# Patient Record
Sex: Male | Born: 1965 | Race: White | Hispanic: No | Marital: Married | Smoking: Never smoker
Health system: Southern US, Community
[De-identification: ages and names within clinical notes are randomized; demographics above are authoritative.]

## PROBLEM LIST (undated history)

## (undated) DIAGNOSIS — I10 Essential (primary) hypertension: Secondary | ICD-10-CM

## (undated) DIAGNOSIS — G4730 Sleep apnea, unspecified: Secondary | ICD-10-CM

## (undated) HISTORY — PX: ESOPHAGOGASTRODUODENOSCOPY ENDOSCOPY: SHX5814

## (undated) HISTORY — DX: Sleep apnea, unspecified: G47.30

---

## 2015-08-13 ENCOUNTER — Emergency Department (HOSPITAL_COMMUNITY)
Admission: EM | Admit: 2015-08-13 | Discharge: 2015-08-13 | Disposition: A | Discharge status: Home / Self Care | Payer: 59 | Attending: Emergency Medicine | Admitting: Emergency Medicine

## 2015-08-13 ENCOUNTER — Emergency Department (HOSPITAL_COMMUNITY): Payer: 59

## 2015-08-13 ENCOUNTER — Encounter (HOSPITAL_COMMUNITY): Payer: Self-pay

## 2015-08-13 DIAGNOSIS — R109 Unspecified abdominal pain: Secondary | ICD-10-CM | POA: Diagnosis present

## 2015-08-13 DIAGNOSIS — R1031 Right lower quadrant pain: Secondary | ICD-10-CM | POA: Diagnosis not present

## 2015-08-13 DIAGNOSIS — I10 Essential (primary) hypertension: Secondary | ICD-10-CM | POA: Diagnosis not present

## 2015-08-13 DIAGNOSIS — Z79899 Other long term (current) drug therapy: Secondary | ICD-10-CM | POA: Diagnosis not present

## 2015-08-13 DIAGNOSIS — Z7982 Long term (current) use of aspirin: Secondary | ICD-10-CM | POA: Diagnosis not present

## 2015-08-13 HISTORY — DX: Essential (primary) hypertension: I10

## 2015-08-13 LAB — URINALYSIS, ROUTINE W REFLEX MICROSCOPIC
BILIRUBIN URINE: NEGATIVE
Glucose, UA: NEGATIVE mg/dL
Hgb urine dipstick: NEGATIVE
KETONES UR: NEGATIVE mg/dL
Leukocytes, UA: NEGATIVE
NITRITE: NEGATIVE
PROTEIN: NEGATIVE mg/dL
Specific Gravity, Urine: 1.011 (ref 1.005–1.030)
UROBILINOGEN UA: 0.2 mg/dL (ref 0.0–1.0)
pH: 6.5 (ref 5.0–8.0)

## 2015-08-13 LAB — COMPREHENSIVE METABOLIC PANEL
ALBUMIN: 4.5 g/dL (ref 3.5–5.0)
ALK PHOS: 41 U/L (ref 38–126)
ALT: 25 U/L (ref 17–63)
ANION GAP: 7 (ref 5–15)
AST: 21 U/L (ref 15–41)
BILIRUBIN TOTAL: 0.5 mg/dL (ref 0.3–1.2)
BUN: 13 mg/dL (ref 6–20)
CALCIUM: 9.4 mg/dL (ref 8.9–10.3)
CO2: 28 mmol/L (ref 22–32)
Chloride: 107 mmol/L (ref 101–111)
Creatinine, Ser: 1.11 mg/dL (ref 0.61–1.24)
GFR calc Af Amer: >60 mL/min (ref >60)
GLUCOSE: 112 mg/dL — AB (ref 65–99)
Potassium: 4 mmol/L (ref 3.5–5.1)
Sodium: 142 mmol/L (ref 135–145)
TOTAL PROTEIN: 7.2 g/dL (ref 6.5–8.1)

## 2015-08-13 LAB — CBC
HCT: 41.4 % (ref 39.0–52.0)
Hemoglobin: 13.7 g/dL (ref 13.0–17.0)
MCH: 27.3 pg (ref 26.0–34.0)
MCHC: 33.1 g/dL (ref 30.0–36.0)
MCV: 82.5 fL (ref 78.0–100.0)
Platelets: 332 10*3/uL (ref 150–400)
RBC: 5.02 MIL/uL (ref 4.22–5.81)
RDW: 13.5 % (ref 11.5–15.5)
WBC: 8.2 10*3/uL (ref 4.0–10.5)

## 2015-08-13 LAB — LIPASE, BLOOD: Lipase: 29 U/L (ref 22–51)

## 2015-08-13 MED ORDER — SODIUM CHLORIDE 0.9 % IV BOLUS (SEPSIS)
500.0000 mL | Freq: Once | INTRAVENOUS | Status: AC
  Administered 2015-08-13: 500 mL via INTRAVENOUS

## 2015-08-13 MED ORDER — IOHEXOL 300 MG/ML  SOLN
100.0000 mL | Freq: Once | INTRAMUSCULAR | Status: AC | PRN
  Administered 2015-08-13: 100 mL via INTRAVENOUS

## 2015-08-13 NOTE — ED Notes (Signed)
Pt made aware of needed urine specimen  

## 2015-08-13 NOTE — Discharge Instructions (Signed)

## 2015-08-13 NOTE — ED Notes (Signed)
He c/o rlq area abd. Discomfort since this Mon.  He saw a doctor at a local [facility]. Before arrival, who recommended he come here when he was found to have rebound tenderness.

## 2015-08-13 NOTE — ED Provider Notes (Signed)
CSN: [id number]     Arrival date & time 08/13/15  1417 History   First MD Initiated Contact with Patient 08/13/15 1647     Chief Complaint  Patient presents with  . Abdominal Pain     (Consider location/radiation/quality/duration/timing/severity/associated sxs/prior Treatment) Patient is a 49 y.o. male presenting with abdominal pain. The history is provided by the patient.  Abdominal Pain Associated symptoms: no chest pain, no diarrhea, no nausea, no shortness of breath and no vomiting    patient presents with abdominal pain. It is in his right abdomen. Began on Wednesday and discontinued over last few days. Not relieved with bowel movements. He has had a decreased appetite. No fevers or chills. No nausea. States he thought it would go away. States he went to an urgent care today and he was told to come in because he has rebound tenderness. No dysuria. He has not had pain like this before.  Past Medical History  Diagnosis Date  . Hypertension    No past surgical history on file. No family history on file. Social History  Substance Use Topics  . Smoking status: Never Smoker   . Smokeless tobacco: None  . Alcohol Use: Yes    Review of Systems  Constitutional: Positive for appetite change. Negative for activity change.  Eyes: Negative for pain.  Respiratory: Negative for chest tightness and shortness of breath.   Cardiovascular: Negative for chest pain and leg swelling.  Gastrointestinal: Positive for abdominal pain. Negative for nausea, vomiting and diarrhea.  Genitourinary: Negative for flank pain.  Musculoskeletal: Negative for back pain and neck stiffness.  Skin: Negative for rash.  Neurological: Negative for weakness, numbness and headaches.  Psychiatric/Behavioral: Negative for behavioral problems.      Allergies  Review of patient's allergies indicates no known allergies.  Home Medications   Prior to Admission medications   Medication Sig Start Date End Date  Taking? Authorizing Provider  aspirin 81 MG chewable tablet Chew 81 mg by mouth daily.   Yes Historical Provider, MD  losartan (COZAAR) 25 MG tablet Take 25 mg by mouth daily. 07/11/15  Yes Historical Provider, MD  Multiple Vitamin (MULTIVITAMIN WITH MINERALS) TABS tablet Take 1 tablet by mouth daily.   Yes Historical Provider, MD   BP 129/85 mmHg  Pulse 51  Temp(Src) 98.3 F (36.8 C) (Oral)  Resp 18  SpO2 100% Physical Exam  Constitutional: He is oriented to person, place, and time. He appears well-developed and well-nourished.  HENT:  Head: Normocephalic and atraumatic.  Eyes: EOM are normal. Pupils are equal, round, and reactive to light.  Neck: Normal range of motion.  Cardiovascular: Normal rate, regular rhythm and normal heart sounds.   No murmur heard. Pulmonary/Chest: Effort normal and breath sounds normal.  Abdominal: Soft. He exhibits no distension. There is tenderness. There is no rebound.  Tenderness to right mid to lower abdomen. No hernias. No rash. No rebound or guarding.  Musculoskeletal: Normal range of motion. He exhibits no edema.  Neurological: He is alert and oriented to person, place, and time. No cranial nerve deficit.  Skin: Skin is warm and dry.  Nursing note and vitals reviewed.   ED Course  Procedures (including critical care time) Labs Review Labs Reviewed  COMPREHENSIVE METABOLIC PANEL - Abnormal; Notable for the following:    Glucose, Bld 112 (*)    All other components within normal limits  LIPASE, BLOOD  CBC  URINALYSIS, ROUTINE W REFLEX MICROSCOPIC (NOT AT [facility]    Imaging Review Ct  Abdomen Pelvis W Contrast  08/13/2015   CLINICAL DATA:  Right lower quadrant abdominal pain for the past 5 days.  EXAM: CT ABDOMEN AND PELVIS WITH CONTRAST  TECHNIQUE: Multidetector CT imaging of the abdomen and pelvis was performed using the standard protocol following bolus administration of intravenous contrast.  CONTRAST:  OMNIPAQUE IOHEXOL 300 MG/ML   SOLN  COMPARISON:  None.  FINDINGS: The appendix has a normal appearance. Small number of sigmoid colon diverticula without evidence of diverticulitis. No gastric or small bowel abnormalities.  Minimal diffuse low density of the liver relative to the spleen. Normal appearing spleen, pancreas, gallbladder, adrenal glands, left kidney, ureters and urinary bladder. 2.7 cm upper pole right renal cyst. No enlarged lymph nodes.  Tiny umbilical hernia containing fat. Normal sized prostate gland containing a small amount calcification. Small amount of linear scarring at both lung bases. Lumbar and lower thoracic spine degenerative changes.  IMPRESSION: 1. No acute abnormality. 2. Mild sigmoid diverticulosis. 3. Tiny umbilical hernia containing fat.   Electronically Signed   By: Beckie Salts M.D.   On: 08/13/2015 18:23   I have personally reviewed and evaluated these images and lab results as part of my medical decision-making.   EKG Interpretation None      MDM   Final diagnoses:  Right lower quadrant abdominal pain   Patient with abdominal pain. CT and lab work reassuring. Overall benign exam did not show rebound tenderness like patient was referred for. Will discharge home.    Benjiman Core, MD 08/14/15 [id number]

## 2016-05-14 IMAGING — CT CT ABD-PELV W/ CM
2 of 5 series · 16 of 46 positions shown, 18 images · IV contrast (OMNIPAQUE 300)
Comparison: None.

CLINICAL DATA: Right lower quadrant abdominal pain for the past 5
days.

EXAM:
CT ABDOMEN AND PELVIS WITH CONTRAST
TECHNIQUE: Multidetector CT imaging of the abdomen and pelvis was performed
using the standard protocol following bolus administration of
intravenous contrast.
CONTRAST:  100mL OMNIPAQUE IOHEXOL 300 MG/ML  SOLN

[Series 2: abd/pel with · axial · 0.81mm/px · z∈[-501,-36]mm · 13 of 105 slices shown, 15 images]
[im 6/105  soft-tissue]
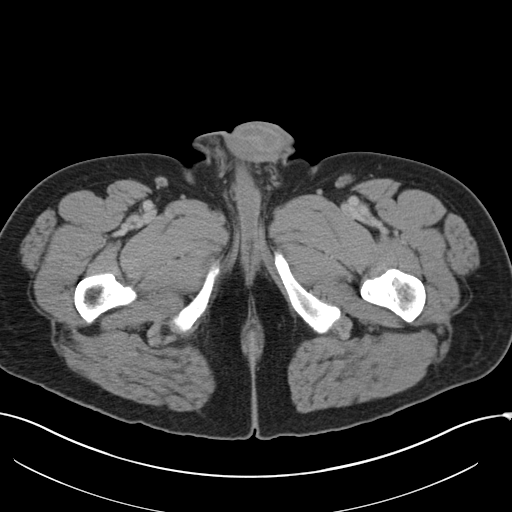
[im 6/105  bone]
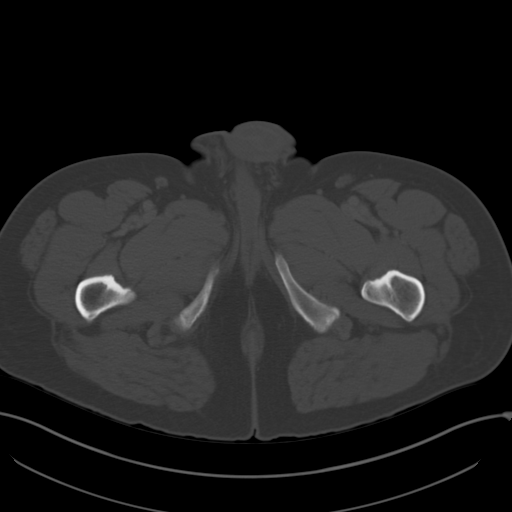
[im 17/105  soft-tissue]
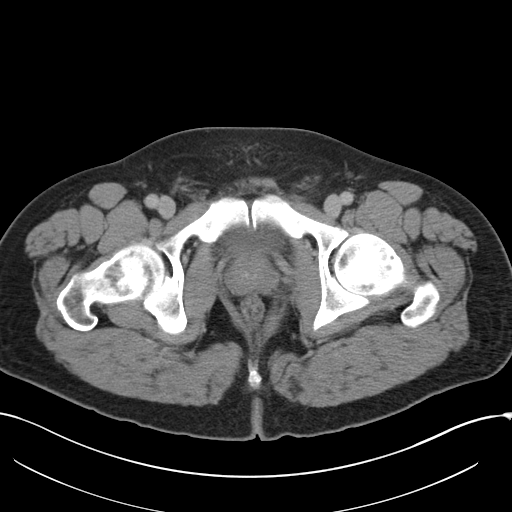
[im 22/105  soft-tissue]
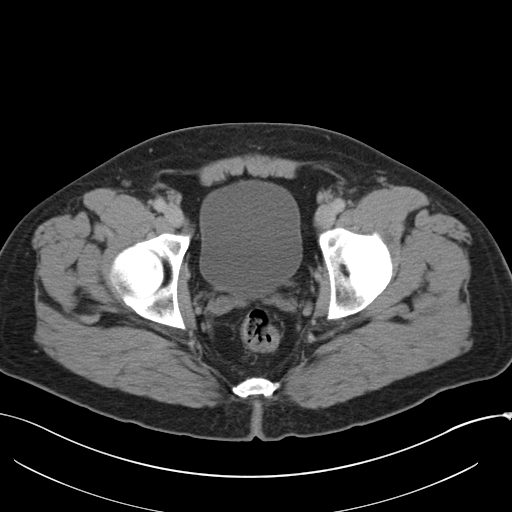
[im 28/105  soft-tissue]
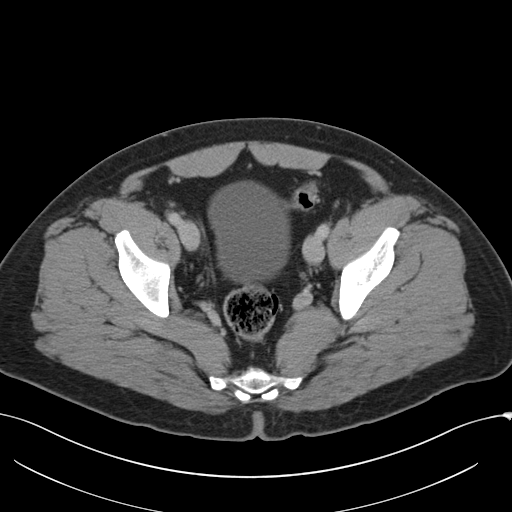
[im 39/105  soft-tissue]
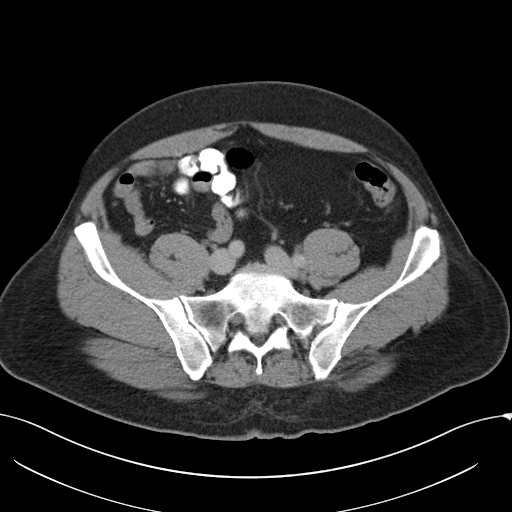
[im 44/105  soft-tissue]
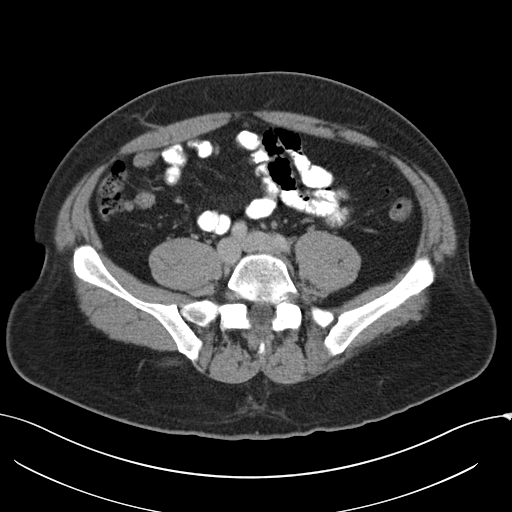
[im 55/105  soft-tissue]
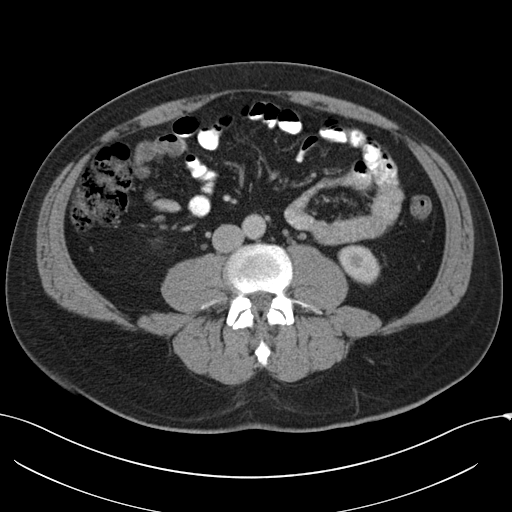
[im 61/105  soft-tissue]
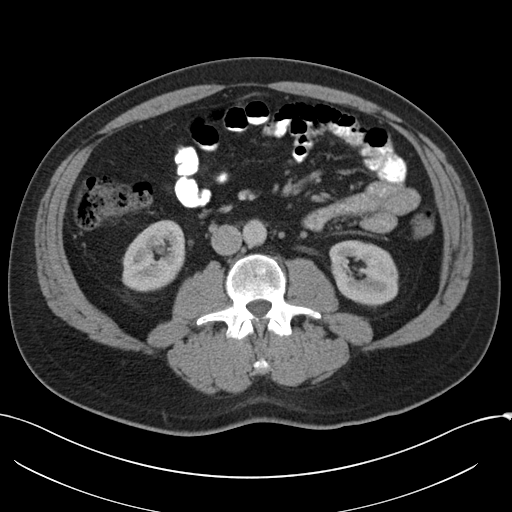
[im 66/105  soft-tissue]
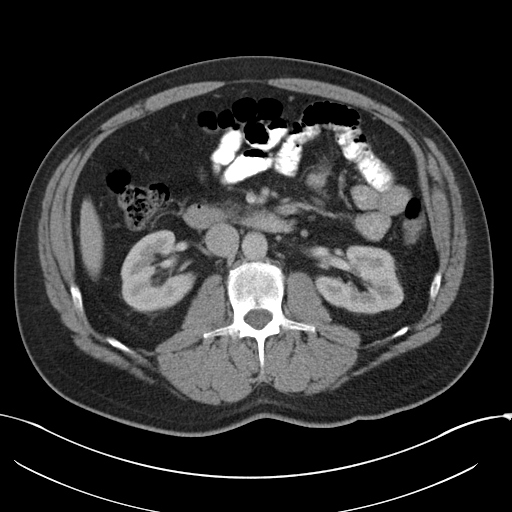
[im 66/105  bone]
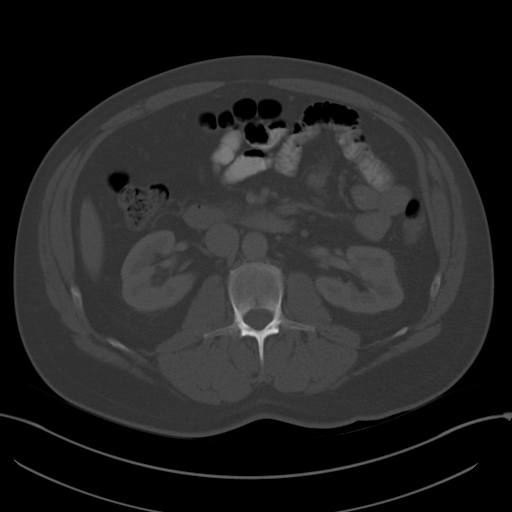
[im 77/105  soft-tissue]
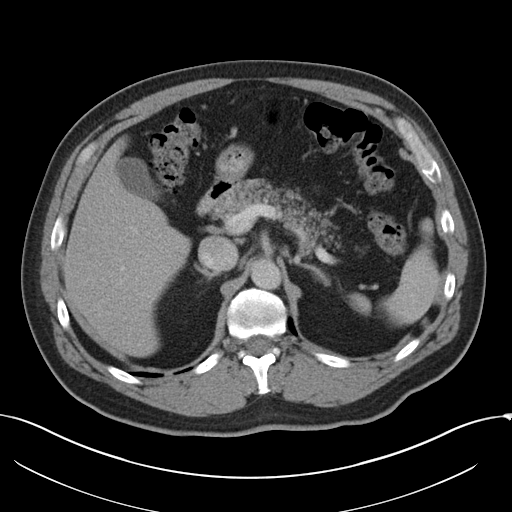
[im 83/105  soft-tissue]
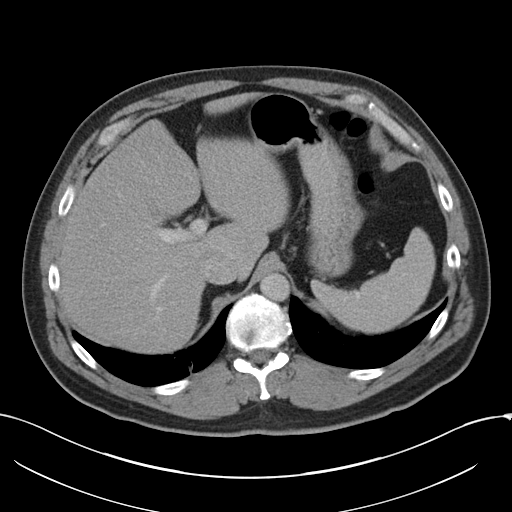
[im 88/105  soft-tissue]
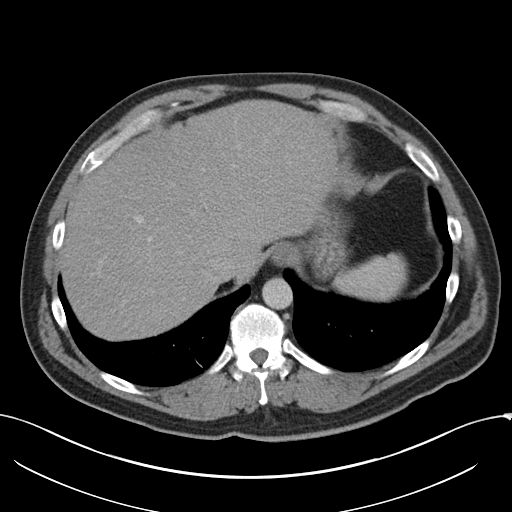
[im 99/105  soft-tissue]
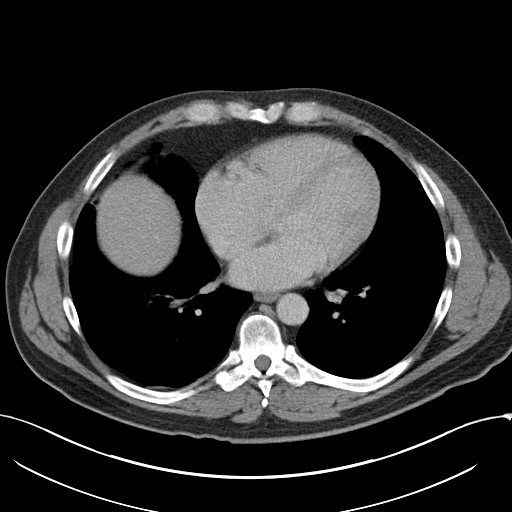

[Series 4: coronal a/|p · coronal · 0.74mm/px · 3 of 126 slices shown]
[im 42/126  soft-tissue]
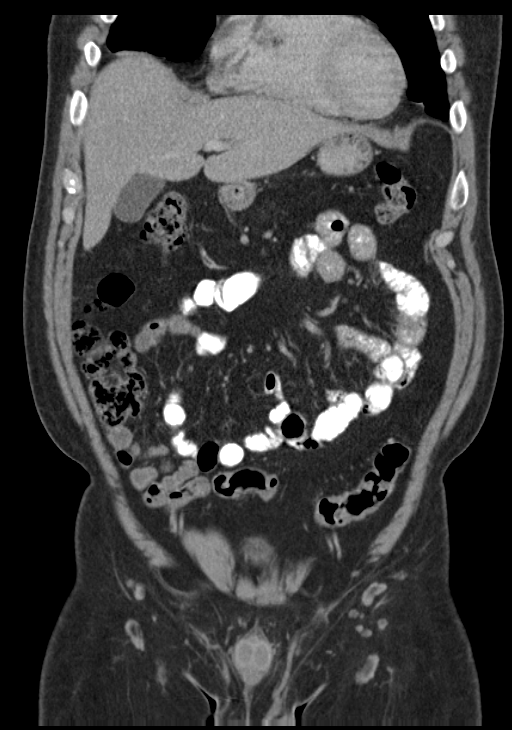
[im 56/126  soft-tissue]
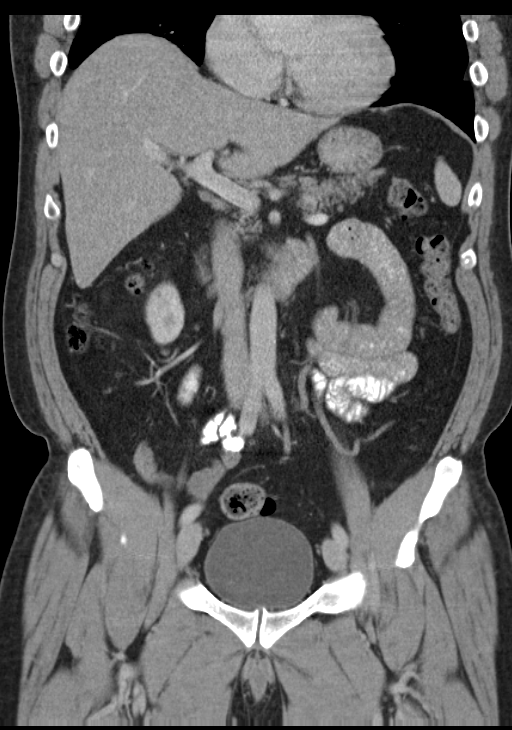
[im 70/126  soft-tissue]
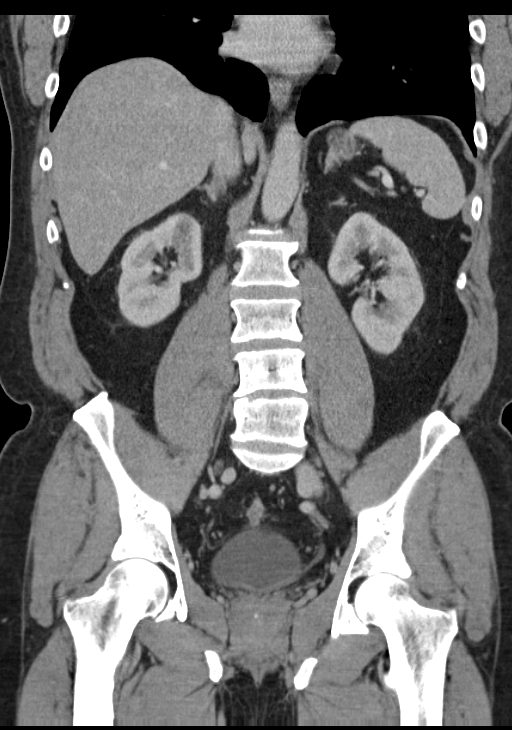

[16 of 46 positions shown; findings below may reference images not displayed]

FINDINGS: The appendix has a normal appearance. Small number of sigmoid colon
diverticula without evidence of diverticulitis. No gastric or small
bowel abnormalities.

Minimal diffuse low density of the liver relative to the spleen.
Normal appearing spleen, pancreas, gallbladder, adrenal glands, left
kidney, ureters and urinary bladder. 2.7 cm upper pole right renal
cyst. No enlarged lymph nodes.

Tiny umbilical hernia containing fat. Normal sized prostate gland
containing a small amount calcification. Small amount of linear
scarring at both lung bases. Lumbar and lower thoracic spine
degenerative changes.
IMPRESSION: 1. No acute abnormality.
2. Mild sigmoid diverticulosis.
3. Tiny umbilical hernia containing fat.

## 2016-06-25 ENCOUNTER — Encounter: Payer: Self-pay | Admitting: Gastroenterology

## 2016-08-10 ENCOUNTER — Encounter: Payer: Self-pay | Admitting: Gastroenterology

## 2016-08-10 ENCOUNTER — Ambulatory Visit (AMBULATORY_SURGERY_CENTER): Payer: Self-pay

## 2016-08-10 VITALS — Ht 70.5 in | Wt 225.0 lb

## 2016-08-10 DIAGNOSIS — Z1211 Encounter for screening for malignant neoplasm of colon: Secondary | ICD-10-CM

## 2016-08-10 MED ORDER — SUPREP BOWEL PREP KIT 17.5-3.13-1.6 GM/177ML PO SOLN: 1.0000 | Freq: Once | ORAL | 0 refills | Status: AC

## 2016-08-10 NOTE — Progress Notes (Signed)
No allergies to eggs or soy No past problems with anesthesia No diet meds No home oxygen  Declined emmi 

## 2016-08-24 ENCOUNTER — Encounter: Payer: Self-pay | Admitting: Gastroenterology

## 2016-08-24 ENCOUNTER — Ambulatory Visit (AMBULATORY_SURGERY_CENTER): Payer: 59 | Admitting: Gastroenterology

## 2016-08-24 VITALS — BP 120/73 | HR 55 | Temp 98.6°F | Resp 11 | Ht 70.5 in | Wt 225.0 lb

## 2016-08-24 DIAGNOSIS — Z1212 Encounter for screening for malignant neoplasm of rectum: Secondary | ICD-10-CM

## 2016-08-24 DIAGNOSIS — Z1211 Encounter for screening for malignant neoplasm of colon: Secondary | ICD-10-CM

## 2016-08-24 MED ORDER — SODIUM CHLORIDE 0.9 % IV SOLN: 500.0000 mL | INTRAVENOUS | Status: AC

## 2016-08-24 NOTE — Op Note (Signed)
[facility] Patient Name: Derek Morales Procedure Date: 08/24/2016 1:26 PM MRN: [id number] Endoscopist: Meryl Dare , MD Age: 50 Referring MD:  Date of Birth: 11-24-1965 Gender: Male Account #: [id number] Procedure:                Colonoscopy Indications:              Screening for colorectal malignant neoplasm Medicines:                Monitored Anesthesia Care Procedure:                Pre-Anesthesia Assessment:                           - Prior to the procedure, a History and Physical                            was performed, and patient medications and                            allergies were reviewed. The patient's tolerance of                            previous anesthesia was also reviewed. The risks                            and benefits of the procedure and the sedation                            options and risks were discussed with the patient.                            All questions were answered, and informed consent                            was obtained. Prior Anticoagulants: The patient has                            taken no previous anticoagulant or antiplatelet                            agents. ASA Grade Assessment: II - A patient with                            mild systemic disease. After reviewing the risks                            and benefits, the patient was deemed in                            satisfactory condition to undergo the procedure.                           After obtaining informed consent, the colonoscope  was passed under direct vision. Throughout the                            procedure, the patient's blood pressure, pulse, and                            oxygen saturations were monitored continuously. The                            Model PCF-H190L [id number]) scope was introduced                            through the anus and advanced to the the cecum,                            identified by  appendiceal orifice and ileocecal                            valve. The ileocecal valve, appendiceal orifice,                            and rectum were photographed. The quality of the                            bowel preparation was excellent. The colonoscopy                            was performed without difficulty. The patient                            tolerated the procedure well. Scope In: 1:38:20 PM Scope Out: 1:51:55 PM Scope Withdrawal Time: 0 hours 11 minutes 24 seconds  Total Procedure Duration: 0 hours 13 minutes 35 seconds  Findings:                 The perianal and digital rectal examinations were                            normal.                           A few medium-mouthed diverticula were found in the                            sigmoid colon.                           The exam was otherwise without abnormality on                            direct and retroflexion views. Complications:            No immediate complications. Estimated blood loss:                            None. Estimated Blood Loss:  Estimated blood loss: none. Impression:               - Diverticulosis in the sigmoid colon.                           - The examination was otherwise normal on direct                            and retroflexion views.                           - No specimens collected. Recommendation:           - Repeat colonoscopy in 10 years for screening                            purposes.                           - Patient has a contact number available for                            emergencies. The signs and symptoms of potential                            delayed complications were discussed with the                            patient. Return to normal activities tomorrow.                            Written discharge instructions were provided to the                            patient.                           - Resume previous diet.                           - Continue  present medications. Meryl DareMalcolm T Lawrie Tunks, MD 08/24/2016 1:54:59 PM This report has been signed electronically.

## 2016-08-24 NOTE — Progress Notes (Signed)
Report to PACU, RN, vss, BBS= Clear.  

## 2016-08-24 NOTE — Patient Instructions (Signed)
YOU HAD AN ENDOSCOPIC PROCEDURE TODAY AT THE [facility]:   Refer to the procedure report that was given to you for any specific questions about what was found during the examination.  If the procedure report does not answer your questions, please call your gastroenterologist to clarify.  If you requested that your care partner not be given the details of your procedure findings, then the procedure report has been included in a sealed envelope for you to review at your convenience later.  YOU SHOULD EXPECT: Some feelings of bloating in the abdomen. Passage of more gas than usual.  Walking can help get rid of the air that was put into your GI tract during the procedure and reduce the bloating. If you had a lower endoscopy (such as a colonoscopy or flexible sigmoidoscopy) you may notice spotting of blood in your stool or on the toilet paper. If you underwent a bowel prep for your procedure, you may not have a normal bowel movement for a few days.  Please Note:  You might notice some irritation and congestion in your nose or some drainage.  This is from the oxygen used during your procedure.  There is no need for concern and it should clear up in a day or so.  SYMPTOMS TO REPORT IMMEDIATELY:   Following lower endoscopy (colonoscopy or flexible sigmoidoscopy):  Excessive amounts of blood in the stool  Significant tenderness or worsening of abdominal pains  Swelling of the abdomen that is new, acute  Fever of 100F or higher   Following upper endoscopy (EGD)  Vomiting of blood or coffee ground material  New chest pain or pain under the shoulder blades  Painful or persistently difficult swallowing  New shortness of breath  Fever of 100F or higher  Black, tarry-looking stools  For urgent or emergent issues, a gastroenterologist can be reached at any hour by calling [phone].   DIET:  We do recommend a small meal at first, but then you may proceed to your regular diet.  Drink  plenty of fluids but you should avoid alcoholic beverages for 24 hours.  ACTIVITY:  You should plan to take it easy for the rest of today and you should NOT DRIVE or use heavy machinery until tomorrow (because of the sedation medicines used during the test).    FOLLOW UP: Our staff will call the number listed on your records the next business day following your procedure to check on you and address any questions or concerns that you may have regarding the information given to you following your procedure. If we do not reach you, we will leave a message.  However, if you are feeling well and you are not experiencing any problems, there is no need to return our call.  We will assume that you have returned to your regular daily activities without incident.  If any biopsies were taken you will be contacted by phone or by letter within the next 1-3 weeks.  Please call us at [phone] if you have not heard about the biopsies in 3 weeks.    SIGNATURES/CONFIDENTIALITY: You and/or your care partner have signed paperwork which will be entered into your electronic medical record.  These signatures attest to the fact that that the information above on your After Visit Summary has been reviewed and is understood.  Full responsibility of the confidentiality of this discharge information lies with you and/or your care-partner.  Diverticulosis and high fiber diet information given. 

## 2016-08-27 ENCOUNTER — Telehealth: Payer: Self-pay

## 2016-08-27 NOTE — Telephone Encounter (Signed)
  Follow up Call-  Call back number 08/24/2016  Post procedure Call Back phone  # [phone]  Permission to leave phone message Yes     Patient questions:  Do you have a fever, pain , or abdominal swelling? No. Pain Score  0 *  Have you tolerated food without any problems? Yes.    Have you been able to return to your normal activities? Yes.    Do you have any questions about your discharge instructions: Diet   No. Medications  No. Follow up visit  No.  Do you have questions or concerns about your Care? No.  Actions: * If pain score is 4 or above: No action needed, pain <4.
